# Patient Record
Sex: Male | Born: 1950 | Race: White | Hispanic: No | Marital: Married | Smoking: Never smoker
Health system: Southern US, Community
[De-identification: ages and names within clinical notes are randomized; demographics above are authoritative.]

## PROBLEM LIST (undated history)

## (undated) DIAGNOSIS — F1010 Alcohol abuse, uncomplicated: Secondary | ICD-10-CM

## (undated) DIAGNOSIS — I10 Essential (primary) hypertension: Secondary | ICD-10-CM

## (undated) DIAGNOSIS — K219 Gastro-esophageal reflux disease without esophagitis: Secondary | ICD-10-CM

## (undated) DIAGNOSIS — E119 Type 2 diabetes mellitus without complications: Secondary | ICD-10-CM

---

## 2013-03-08 ENCOUNTER — Emergency Department (HOSPITAL_COMMUNITY): Payer: BC Managed Care – PPO

## 2013-03-08 ENCOUNTER — Encounter (HOSPITAL_COMMUNITY): Payer: Self-pay | Admitting: Emergency Medicine

## 2013-03-08 ENCOUNTER — Observation Stay (HOSPITAL_COMMUNITY)
Admission: EM | Admit: 2013-03-08 | Discharge: 2013-03-09 | Disposition: A | Discharge status: Home / Self Care | Payer: BC Managed Care – PPO | Attending: General Surgery | Admitting: General Surgery

## 2013-03-08 DIAGNOSIS — K219 Gastro-esophageal reflux disease without esophagitis: Secondary | ICD-10-CM | POA: Insufficient documentation

## 2013-03-08 DIAGNOSIS — W108XXA Fall (on) (from) other stairs and steps, initial encounter: Secondary | ICD-10-CM | POA: Insufficient documentation

## 2013-03-08 DIAGNOSIS — S2249XA Multiple fractures of ribs, unspecified side, initial encounter for closed fracture: Secondary | ICD-10-CM

## 2013-03-08 DIAGNOSIS — R079 Chest pain, unspecified: Secondary | ICD-10-CM | POA: Insufficient documentation

## 2013-03-08 DIAGNOSIS — IMO0001 Reserved for inherently not codable concepts without codable children: Secondary | ICD-10-CM | POA: Insufficient documentation

## 2013-03-08 DIAGNOSIS — I10 Essential (primary) hypertension: Secondary | ICD-10-CM | POA: Insufficient documentation

## 2013-03-08 DIAGNOSIS — R0902 Hypoxemia: Secondary | ICD-10-CM | POA: Diagnosis present

## 2013-03-08 DIAGNOSIS — F1010 Alcohol abuse, uncomplicated: Secondary | ICD-10-CM | POA: Insufficient documentation

## 2013-03-08 DIAGNOSIS — H1045 Other chronic allergic conjunctivitis: Secondary | ICD-10-CM | POA: Insufficient documentation

## 2013-03-08 DIAGNOSIS — S2231XA Fracture of one rib, right side, initial encounter for closed fracture: Secondary | ICD-10-CM

## 2013-03-08 HISTORY — DX: Alcohol abuse, uncomplicated: F10.10

## 2013-03-08 HISTORY — DX: Essential (primary) hypertension: I10

## 2013-03-08 HISTORY — DX: Type 2 diabetes mellitus without complications: E11.9

## 2013-03-08 HISTORY — DX: Gastro-esophageal reflux disease without esophagitis: K21.9

## 2013-03-08 LAB — COMPREHENSIVE METABOLIC PANEL
ALT: 36 U/L (ref 0–53)
AST: 34 U/L (ref 0–37)
Albumin: 3.8 g/dL (ref 3.5–5.2)
Alkaline Phosphatase: 92 U/L (ref 39–117)
BUN: 21 mg/dL (ref 6–23)
Chloride: 104 mEq/L (ref 96–112)
Potassium: 4.3 mEq/L (ref 3.5–5.1)
Sodium: 141 mEq/L (ref 135–145)
Total Bilirubin: 0.3 mg/dL (ref 0.3–1.2)
Total Protein: 7.7 g/dL (ref 6.0–8.3)

## 2013-03-08 LAB — CBC WITH DIFFERENTIAL/PLATELET
Basophils Absolute: 0 10*3/uL (ref 0.0–0.1)
Basophils Relative: 1 % (ref 0–1)
Eosinophils Absolute: 0.1 10*3/uL (ref 0.0–0.7)
Hemoglobin: 15 g/dL (ref 13.0–17.0)
MCHC: 34.2 g/dL (ref 30.0–36.0)
Monocytes Relative: 10 % (ref 3–12)
Neutro Abs: 5.3 10*3/uL (ref 1.7–7.7)
Neutrophils Relative %: 70 % (ref 43–77)
Platelets: 201 10*3/uL (ref 150–400)

## 2013-03-08 LAB — MRSA PCR SCREENING: MRSA by PCR: NEGATIVE

## 2013-03-08 LAB — GLUCOSE, CAPILLARY
Glucose-Capillary: 101 mg/dL — ABNORMAL HIGH (ref 70–99)
Glucose-Capillary: 202 mg/dL — ABNORMAL HIGH (ref 70–99)

## 2013-03-08 MED ORDER — HYDROMORPHONE HCL PF 1 MG/ML IJ SOLN
1.0000 mg | Freq: Once | INTRAMUSCULAR | Status: AC
  Administered 2013-03-08: 1 mg via INTRAVENOUS
  Filled 2013-03-08: qty 1

## 2013-03-08 MED ORDER — INSULIN ASPART PROT & ASPART (70-30 MIX) 100 UNIT/ML ~~LOC~~ SUSP
15.0000 [IU] | Freq: Every day | SUBCUTANEOUS | Status: DC
  Filled 2013-03-08: qty 10

## 2013-03-08 MED ORDER — NEBIVOLOL HCL 10 MG PO TABS
10.0000 mg | ORAL_TABLET | Freq: Every day | ORAL | Status: DC
  Administered 2013-03-08: 10 mg via ORAL
  Filled 2013-03-08 (×2): qty 1

## 2013-03-08 MED ORDER — OXYCODONE HCL 5 MG PO TABS
10.0000 mg | ORAL_TABLET | ORAL | Status: DC | PRN
  Administered 2013-03-08 – 2013-03-09 (×2): 10 mg via ORAL
  Filled 2013-03-08 (×2): qty 2

## 2013-03-08 MED ORDER — IOHEXOL 300 MG/ML  SOLN
120.0000 mL | Freq: Once | INTRAMUSCULAR | Status: AC | PRN
  Administered 2013-03-08: 120 mL via INTRAVENOUS

## 2013-03-08 MED ORDER — ONDANSETRON HCL 8 MG PO TABS
4.0000 mg | ORAL_TABLET | Freq: Four times a day (QID) | ORAL | Status: DC | PRN
  Filled 2013-03-08: qty 0.5

## 2013-03-08 MED ORDER — ONDANSETRON HCL 4 MG/2ML IJ SOLN: 4.0000 mg | Freq: Four times a day (QID) | INTRAMUSCULAR | Status: DC | PRN

## 2013-03-08 MED ORDER — HYDROMORPHONE HCL PF 1 MG/ML IJ SOLN
0.5000 mg | INTRAMUSCULAR | Status: DC | PRN
  Administered 2013-03-08: 1 mg via INTRAVENOUS
  Filled 2013-03-08: qty 1

## 2013-03-08 MED ORDER — SODIUM CHLORIDE 0.9 % IV BOLUS (SEPSIS)
1000.0000 mL | Freq: Once | INTRAVENOUS | Status: AC
  Administered 2013-03-08: 1000 mL via INTRAVENOUS

## 2013-03-08 MED ORDER — HYDROMORPHONE HCL PF 1 MG/ML IJ SOLN: 1.0000 mg | INTRAMUSCULAR | Status: DC | PRN

## 2013-03-08 MED ORDER — ENOXAPARIN SODIUM 40 MG/0.4ML ~~LOC~~ SOLN
40.0000 mg | SUBCUTANEOUS | Status: DC
  Administered 2013-03-08: 40 mg via SUBCUTANEOUS
  Filled 2013-03-08 (×2): qty 0.4

## 2013-03-08 MED ORDER — KETOROLAC TROMETHAMINE 30 MG/ML IJ SOLN
30.0000 mg | Freq: Once | INTRAMUSCULAR | Status: AC
  Administered 2013-03-08: 30 mg via INTRAVENOUS
  Filled 2013-03-08: qty 1

## 2013-03-08 MED ORDER — AMLODIPINE BESYLATE 5 MG PO TABS
5.0000 mg | ORAL_TABLET | Freq: Every day | ORAL | Status: DC
  Administered 2013-03-08: 5 mg via ORAL
  Filled 2013-03-08 (×2): qty 1

## 2013-03-08 MED ORDER — POTASSIUM CHLORIDE IN NACL 20-0.9 MEQ/L-% IV SOLN
INTRAVENOUS | Status: DC
  Administered 2013-03-08: 10 mL via INTRAVENOUS
  Filled 2013-03-08: qty 1000

## 2013-03-08 MED ORDER — INSULIN ASPART PROT & ASPART (70-30 MIX) 100 UNIT/ML ~~LOC~~ SUSP
25.0000 [IU] | Freq: Every day | SUBCUTANEOUS | Status: DC
  Administered 2013-03-09: 25 [IU] via SUBCUTANEOUS
  Filled 2013-03-08: qty 10

## 2013-03-08 MED ORDER — DOCUSATE SODIUM 100 MG PO CAPS
100.0000 mg | ORAL_CAPSULE | Freq: Two times a day (BID) | ORAL | Status: DC
  Administered 2013-03-08: 100 mg via ORAL
  Filled 2013-03-08: qty 1

## 2013-03-08 MED ORDER — PANTOPRAZOLE SODIUM 40 MG PO TBEC
40.0000 mg | DELAYED_RELEASE_TABLET | Freq: Every day | ORAL | Status: DC
  Administered 2013-03-08: 40 mg via ORAL
  Filled 2013-03-08: qty 1

## 2013-03-08 MED ORDER — IRBESARTAN 300 MG PO TABS
300.0000 mg | ORAL_TABLET | Freq: Every day | ORAL | Status: DC
  Administered 2013-03-08: 300 mg via ORAL
  Filled 2013-03-08 (×2): qty 1

## 2013-03-08 MED ORDER — AMLODIPINE-OLMESARTAN 10-40 MG PO TABS: 1.0000 | ORAL_TABLET | Freq: Every day | ORAL | Status: DC

## 2013-03-08 NOTE — ED Notes (Signed)
On assessment pt hands are shaky.

## 2013-03-08 NOTE — H&P (Signed)
Ian Figueroa is an 62 y.o. male.   Chief Complaint: Right sided chest wall pain. HPI: Ground level fall and hit his right chest.  Passed out and has done so before.  The patient drinks a lot, but will not admit to being an alcoholic.  Past Medical History  Diagnosis Date  . Diabetes mellitus without complication   . Hypertension   . Acid reflux   . ETOH abuse     last drank on SAT.    History reviewed. No pertinent past surgical history.  History reviewed. No pertinent family history. Social History:  reports that he has never smoked. He does not have any smokeless tobacco history on file. He reports that  drinks alcohol. He reports that he does not use illicit drugs.  Allergies: No Known Allergies  Medications Prior to Admission  Medication Sig Dispense Refill  . amLODipine-olmesartan (AZOR) 10-40 MG per tablet Take 1 tablet by mouth daily.      . insulin aspart protamine- aspart (NOVOLOG 70/30) (70-30) 100 UNIT/ML injection Inject 15-25 Units into the skin 2 (two) times daily with a meal. Take 25 units at breakfast and 15 units with dinner      . Naproxen Sodium (ALEVE PO) Take 2 tablets by mouth every morning.      . nebivolol (BYSTOLIC) 10 MG tablet Take 10 mg by mouth daily.      Marland Kitchen omeprazole (PRILOSEC) 20 MG capsule Take 20 mg by mouth daily.        Results for orders placed during the hospital encounter of 03/08/13 (from the past 48 hour(s))  CBC WITH DIFFERENTIAL     Status: None   Collection Time    03/08/13 11:31 AM      Result Value Range   WBC 7.5  4.0 - 10.5 K/uL   RBC 5.08  4.22 - 5.81 MIL/uL   Hemoglobin 15.0  13.0 - 17.0 g/dL   HCT 11.9  14.7 - 82.9 %   MCV 86.4  78.0 - 100.0 fL   MCH 29.5  26.0 - 34.0 pg   MCHC 34.2  30.0 - 36.0 g/dL   RDW 56.2  13.0 - 86.5 %   Platelets 201  150 - 400 K/uL   Neutrophils Relative 70  43 - 77 %   Neutro Abs 5.3  1.7 - 7.7 K/uL   Lymphocytes Relative 19  12 - 46 %   Lymphs Abs 1.4  0.7 - 4.0 K/uL   Monocytes Relative 10   3 - 12 %   Monocytes Absolute 0.7  0.1 - 1.0 K/uL   Eosinophils Relative 1  0 - 5 %   Eosinophils Absolute 0.1  0.0 - 0.7 K/uL   Basophils Relative 1  0 - 1 %   Basophils Absolute 0.0  0.0 - 0.1 K/uL  COMPREHENSIVE METABOLIC PANEL     Status: Abnormal   Collection Time    03/08/13 11:31 AM      Result Value Range   Sodium 141  135 - 145 mEq/L   Potassium 4.3  3.5 - 5.1 mEq/L   Chloride 104  96 - 112 mEq/L   CO2 22  19 - 32 mEq/L   Glucose, Bld 166 (*) 70 - 99 mg/dL   BUN 21  6 - 23 mg/dL   Creatinine, Ser 7.84  0.50 - 1.35 mg/dL   Calcium 9.0  8.4 - 69.6 mg/dL   Total Protein 7.7  6.0 - 8.3 g/dL   Albumin 3.8  3.5 - 5.2 g/dL   AST 34  0 - 37 U/L   ALT 36  0 - 53 U/L   Alkaline Phosphatase 92  39 - 117 U/L   Total Bilirubin 0.3  0.3 - 1.2 mg/dL   GFR calc non Af Amer 60 (*) >90 mL/min   GFR calc Af Amer 69 (*) >90 mL/min   Comment:            The eGFR has been calculated     using the CKD EPI equation.     This calculation has not been     validated in all clinical     situations.     eGFR's persistently     <90 mL/min signify     possible Chronic Kidney Disease.  GLUCOSE, CAPILLARY     Status: Abnormal   Collection Time    03/08/13  5:14 PM      Result Value Range   Glucose-Capillary 101 (*) 70 - 99 mg/dL   Comment 1 Notify RN     Dg Ribs Unilateral W/chest Right  03/08/2013  *RADIOLOGY REPORT*  Clinical Data: Fall, syncope  RIGHT RIBS AND CHEST - 3+ VIEW  Comparison: None.  Findings: Borderline cardiomegaly.  Streaky left basilar atelectasis or infiltrate.  Displaced fracture of the right eighth rib.  Mild displaced fracture of the right ninth ribs.  No diagnostic pneumothorax.  IMPRESSION:  Streaky left basilar atelectasis or infiltrate.  Displaced fracture of the right eighth rib.  Mild displaced fracture of the right ninth ribs.  No diagnostic pneumothorax.   Original Report Authenticated By: Natasha Mead, M.D.    Ct Head Wo Contrast  03/08/2013  *RADIOLOGY REPORT*   Clinical Data: Syncope and fall.  CT HEAD WITHOUT CONTRAST  Technique:  Contiguous axial images were obtained from the base of the skull through the vertex without contrast.  Comparison: None.  Findings: No evidence for acute hemorrhage, mass lesion, midline shift, hydrocephalus or large infarct.  Normal appearance of the ventricles and basal cisterns.  Small calcification in the left basal ganglia.  No acute bony abnormality.  IMPRESSION: No acute intracranial abnormality.   Original Report Authenticated By: Richarda Overlie, M.D.    Ct Chest W Contrast  03/08/2013  *RADIOLOGY REPORT*  Clinical Data:  Syncopal episode and right rib pain.  CT CHEST, ABDOMEN AND PELVIS WITH CONTRAST  Technique:  Multidetector CT imaging of the chest, abdomen and pelvis was performed following the standard protocol during bolus administration of intravenous contrast.  Contrast: OMNIPAQUE IOHEXOL 300 MG/ML  SOLN  Comparison:  Chest radiograph 03/08/2013  CT CHEST  Findings:  No evidence for chest lymphadenopathy.  No evidence for a mediastinal hematoma.  Heart size is slightly enlarged.  No significant pericardial or pleural fluid.  The trachea and mainstem bronchi are patent.  Question a 3 mm pleural based nodule in the right upper lung on sequence three, image 16.  There may be an accessory fissure in the right upper lobe.  There is volume loss in the posterior right lower lobe. Patchy streaky densities in the left lung base most likely represent atelectasis.  Nondisplaced fracture of the lateral right seventh rib.  There is a displaced fracture of the right eighth rib. The right tenth rib is fractured in two spots.  There is no evidence for a pneumothorax. The right scapula is intact.  The shoulders appear to be located. Normal alignment of the thoracic spine.  IMPRESSION: Multiple right rib fractures.  No  evidence for a pneumothorax.  Low lung volumes with patchy areas of presumed atelectasis.  3 mm nodular structure in the right  upper lung is nonspecific.  If the patient is at high risk for bronchogenic carcinoma, follow-up chest CT at 1 year is recommended.  If the patient is at low risk, no follow-up is needed.  This recommendation follows the consensus statement: Guidelines for Management of Small Pulmonary Nodules Detected on CT Scans:  A Statement from the Fleischner Society as published in Radiology 2005; 237:395-400.  CT ABDOMEN AND PELVIS  Findings:  No evidence for free intraperitoneal air.  Low attenuation of the liver could represent hepatic steatosis.  No focal abnormality in the liver.  The gallbladder has been removed. Normal appearance of the portal venous system.  Normal appearance of the spleen, pancreas, kidneys and adrenal glands.  No significant free fluid or lymphadenopathy.  No gross abnormality to the urinary bladder, prostate or seminal vesicles.  Large amount stool in the cecum.  Appendix has a normal appearance.  Normal appearance of the small bowel structures.  Both hips are located.  There is subchondral sclerosis and mild cortical irregularity of the superior left femoral head. Degenerative facet changes in the lower lumbar spine.  IMPRESSION: No acute abnormality within the abdomen or pelvis.  Low attenuation of the liver may represent hepatic steatosis.  Subchondral sclerosis and irregularity of the left femoral head. Findings could be degenerative but cannot exclude AVN.   Original Report Authenticated By: Richarda Overlie, M.D.    Ct Abdomen Pelvis W Contrast  03/08/2013  *RADIOLOGY REPORT*  Clinical Data:  Syncopal episode and right rib pain.  CT CHEST, ABDOMEN AND PELVIS WITH CONTRAST  Technique:  Multidetector CT imaging of the chest, abdomen and pelvis was performed following the standard protocol during bolus administration of intravenous contrast.  Contrast: OMNIPAQUE IOHEXOL 300 MG/ML  SOLN  Comparison:  Chest radiograph 03/08/2013  CT CHEST  Findings:  No evidence for chest lymphadenopathy.  No  evidence for a mediastinal hematoma.  Heart size is slightly enlarged.  No significant pericardial or pleural fluid.  The trachea and mainstem bronchi are patent.  Question a 3 mm pleural based nodule in the right upper lung on sequence three, image 16.  There may be an accessory fissure in the right upper lobe.  There is volume loss in the posterior right lower lobe. Patchy streaky densities in the left lung base most likely represent atelectasis.  Nondisplaced fracture of the lateral right seventh rib.  There is a displaced fracture of the right eighth rib. The right tenth rib is fractured in two spots.  There is no evidence for a pneumothorax. The right scapula is intact.  The shoulders appear to be located. Normal alignment of the thoracic spine.  IMPRESSION: Multiple right rib fractures.  No evidence for a pneumothorax.  Low lung volumes with patchy areas of presumed atelectasis.  3 mm nodular structure in the right upper lung is nonspecific.  If the patient is at high risk for bronchogenic carcinoma, follow-up chest CT at 1 year is recommended.  If the patient is at low risk, no follow-up is needed.  This recommendation follows the consensus statement: Guidelines for Management of Small Pulmonary Nodules Detected on CT Scans:  A Statement from the Fleischner Society as published in Radiology 2005; 237:395-400.  CT ABDOMEN AND PELVIS  Findings:  No evidence for free intraperitoneal air.  Low attenuation of the liver could represent hepatic steatosis.  No focal  abnormality in the liver.  The gallbladder has been removed. Normal appearance of the portal venous system.  Normal appearance of the spleen, pancreas, kidneys and adrenal glands.  No significant free fluid or lymphadenopathy.  No gross abnormality to the urinary bladder, prostate or seminal vesicles.  Large amount stool in the cecum.  Appendix has a normal appearance.  Normal appearance of the small bowel structures.  Both hips are located.  There is  subchondral sclerosis and mild cortical irregularity of the superior left femoral head. Degenerative facet changes in the lower lumbar spine.  IMPRESSION: No acute abnormality within the abdomen or pelvis.  Low attenuation of the liver may represent hepatic steatosis.  Subchondral sclerosis and irregularity of the left femoral head. Findings could be degenerative but cannot exclude AVN.   Original Report Authenticated By: Richarda Overlie, M.D.     Review of Systems  Constitutional: Negative.   Eyes: Negative.   Respiratory: Positive for shortness of breath (mild hypoxermia).   Cardiovascular: Negative.   Gastrointestinal: Negative.   Genitourinary: Negative.   Musculoskeletal: Negative.   Neurological: Negative.   Endo/Heme/Allergies: Negative.   Psychiatric/Behavioral: Negative.     Blood pressure 159/79, pulse 91, temperature 98.3 F (36.8 C), temperature source Oral, resp. rate 33, height 6' (1.829 m), weight 109.2 kg (240 lb 11.9 oz), SpO2 97.00%. Physical Exam  Constitutional: He is oriented to person, place, and time. He appears well-developed and well-nourished.  HENT:  Head: Normocephalic and atraumatic.  Eyes: Conjunctivae and EOM are normal. Pupils are equal, round, and reactive to light.  Neck: Normal range of motion. Neck supple.  Cardiovascular: Normal rate, regular rhythm and normal heart sounds.   Respiratory: Effort normal and breath sounds normal. No respiratory distress. He exhibits tenderness. He exhibits no crepitus, no deformity and no swelling.    GI: Soft. Bowel sounds are normal.  Musculoskeletal: Normal range of motion.  Neurological: He is alert and oriented to person, place, and time. He has normal reflexes.  Skin: Skin is warm and dry.  Psychiatric: He has a normal mood and affect. His behavior is normal. Judgment and thought content normal.     Assessment/Plan Ground level fall with multiple right rib fractures.  I am not concerned about intra-abdominal  bleeding based on the patient's exam and he had a negative FAST.    Will admit for pain control and oxygen.  Also for ETOH withdrawal.  He was mildly hypoxemic.  Cherylynn Ridges 03/08/2013, 5:38 PM

## 2013-03-08 NOTE — ED Provider Notes (Signed)
History     CSN: [id number]  Arrival date & time 03/08/13  1104   First MD Initiated Contact with Patient 03/08/13 1117      Chief Complaint  Patient presents with  . Fall    (Consider location/radiation/quality/duration/timing/severity/associated sxs/prior treatment) Patient is a 62 y.o. male presenting with fall.  Fall   Pt from out of town, reports he was drinking last night and 'blacked out' while walking down some stairs, fell landing on his R side. He has had severe sharp pain in R chest and R lower back, worse with movement and coughing. Unable to take a deep breath due to pain. Denies head injury. No headache or neck pain. No hematuria or fever.   Past Medical History  Diagnosis Date  . Diabetes mellitus without complication   . Hypertension   . Acid reflux   . ETOH abuse     last drank on SAT.    History reviewed. No pertinent past surgical history.  History reviewed. No pertinent family history.  History  Substance Use Topics  . Smoking status: Never Smoker   . Smokeless tobacco: Not on file  . Alcohol Use: Yes      Review of Systems All other systems reviewed and are negative except as noted in HPI.   Allergies  Review of patient's allergies indicates no known allergies.  Home Medications   Current Outpatient Rx  Name  Route  Sig  Dispense  Refill  . amLODipine-olmesartan (AZOR) 10-40 MG per tablet   Oral   Take 1 tablet by mouth daily.         . insulin aspart protamine- aspart (NOVOLOG 70/30) (70-30) 100 UNIT/ML injection   Subcutaneous   Inject 15-25 Units into the skin daily with breakfast.         . Naproxen Sodium (ALEVE PO)   Oral   Take 2 tablets by mouth every morning.         . nebivolol (BYSTOLIC) 10 MG tablet   Oral   Take 10 mg by mouth daily.         Marland Kitchen omeprazole (PRILOSEC) 20 MG capsule   Oral   Take 20 mg by mouth daily.           BP 135/70  Pulse 100  Temp(Src) 98.3 F (36.8 C) (Oral)  Resp 22  SpO2  94%  Physical Exam  Nursing note and vitals reviewed. Constitutional: He is oriented to person, place, and time. He appears well-developed and well-nourished.  HENT:  Head: Normocephalic and atraumatic.  Eyes: EOM are normal. Pupils are equal, round, and reactive to light.  Neck: Normal range of motion. Neck supple.  Cardiovascular: Normal rate, normal heart sounds and intact distal pulses.   Pulmonary/Chest: Effort normal and breath sounds normal. He exhibits tenderness (diffuse right sided chest wall pain).  Abdominal: Bowel sounds are normal. He exhibits no distension. There is no tenderness.  Musculoskeletal: Normal range of motion. He exhibits no edema and no tenderness.  No midline cervical or lumbar spine tenderness  Neurological: He is alert and oriented to person, place, and time. He has normal strength. No cranial nerve deficit or sensory deficit.  Skin: Skin is warm and dry. No rash noted.  Psychiatric: He has a normal mood and affect.    ED Course  Procedures (including critical care time)  Labs Reviewed  COMPREHENSIVE METABOLIC PANEL - Abnormal; Notable for the following:    Glucose, Bld 166 (*)    GFR calc  non Af Amer 60 (*)    GFR calc Af Amer 69 (*)    All other components within normal limits  CBC WITH DIFFERENTIAL  URINALYSIS, ROUTINE W REFLEX MICROSCOPIC   Dg Ribs Unilateral W/chest Right  03/08/2013  *RADIOLOGY REPORT*  Clinical Data: Fall, syncope  RIGHT RIBS AND CHEST - 3+ VIEW  Comparison: None.  Findings: Borderline cardiomegaly.  Streaky left basilar atelectasis or infiltrate.  Displaced fracture of the right eighth rib.  Mild displaced fracture of the right ninth ribs.  No diagnostic pneumothorax.  IMPRESSION:  Streaky left basilar atelectasis or infiltrate.  Displaced fracture of the right eighth rib.  Mild displaced fracture of the right ninth ribs.  No diagnostic pneumothorax.   Original Report Authenticated By: Natasha Mead, M.D.    Ct Head Wo  Contrast  03/08/2013  *RADIOLOGY REPORT*  Clinical Data: Syncope and fall.  CT HEAD WITHOUT CONTRAST  Technique:  Contiguous axial images were obtained from the base of the skull through the vertex without contrast.  Comparison: None.  Findings: No evidence for acute hemorrhage, mass lesion, midline shift, hydrocephalus or large infarct.  Normal appearance of the ventricles and basal cisterns.  Small calcification in the left basal ganglia.  No acute bony abnormality.  IMPRESSION: No acute intracranial abnormality.   Original Report Authenticated By: Richarda Overlie, M.D.    Ct Chest W Contrast  03/08/2013  *RADIOLOGY REPORT*  Clinical Data:  Syncopal episode and right rib pain.  CT CHEST, ABDOMEN AND PELVIS WITH CONTRAST  Technique:  Multidetector CT imaging of the chest, abdomen and pelvis was performed following the standard protocol during bolus administration of intravenous contrast.  Contrast: OMNIPAQUE IOHEXOL 300 MG/ML  SOLN  Comparison:  Chest radiograph 03/08/2013  CT CHEST  Findings:  No evidence for chest lymphadenopathy.  No evidence for a mediastinal hematoma.  Heart size is slightly enlarged.  No significant pericardial or pleural fluid.  The trachea and mainstem bronchi are patent.  Question a 3 mm pleural based nodule in the right upper lung on sequence three, image 16.  There may be an accessory fissure in the right upper lobe.  There is volume loss in the posterior right lower lobe. Patchy streaky densities in the left lung base most likely represent atelectasis.  Nondisplaced fracture of the lateral right seventh rib.  There is a displaced fracture of the right eighth rib. The right tenth rib is fractured in two spots.  There is no evidence for a pneumothorax. The right scapula is intact.  The shoulders appear to be located. Normal alignment of the thoracic spine.  IMPRESSION: Multiple right rib fractures.  No evidence for a pneumothorax.  Low lung volumes with patchy areas of presumed  atelectasis.  3 mm nodular structure in the right upper lung is nonspecific.  If the patient is at high risk for bronchogenic carcinoma, follow-up chest CT at 1 year is recommended.  If the patient is at low risk, no follow-up is needed.  This recommendation follows the consensus statement: Guidelines for Management of Small Pulmonary Nodules Detected on CT Scans:  A Statement from the Fleischner Society as published in Radiology 2005; 237:395-400.  CT ABDOMEN AND PELVIS  Findings:  No evidence for free intraperitoneal air.  Low attenuation of the liver could represent hepatic steatosis.  No focal abnormality in the liver.  The gallbladder has been removed. Normal appearance of the portal venous system.  Normal appearance of the spleen, pancreas, kidneys and adrenal glands.  No  significant free fluid or lymphadenopathy.  No gross abnormality to the urinary bladder, prostate or seminal vesicles.  Large amount stool in the cecum.  Appendix has a normal appearance.  Normal appearance of the small bowel structures.  Both hips are located.  There is subchondral sclerosis and mild cortical irregularity of the superior left femoral head. Degenerative facet changes in the lower lumbar spine.  IMPRESSION: No acute abnormality within the abdomen or pelvis.  Low attenuation of the liver may represent hepatic steatosis.  Subchondral sclerosis and irregularity of the left femoral head. Findings could be degenerative but cannot exclude AVN.   Original Report Authenticated By: Richarda Overlie, M.D.    Ct Abdomen Pelvis W Contrast  03/08/2013  *RADIOLOGY REPORT*  Clinical Data:  Syncopal episode and right rib pain.  CT CHEST, ABDOMEN AND PELVIS WITH CONTRAST  Technique:  Multidetector CT imaging of the chest, abdomen and pelvis was performed following the standard protocol during bolus administration of intravenous contrast.  Contrast: OMNIPAQUE IOHEXOL 300 MG/ML  SOLN  Comparison:  Chest radiograph 03/08/2013  CT CHEST   Findings:  No evidence for chest lymphadenopathy.  No evidence for a mediastinal hematoma.  Heart size is slightly enlarged.  No significant pericardial or pleural fluid.  The trachea and mainstem bronchi are patent.  Question a 3 mm pleural based nodule in the right upper lung on sequence three, image 16.  There may be an accessory fissure in the right upper lobe.  There is volume loss in the posterior right lower lobe. Patchy streaky densities in the left lung base most likely represent atelectasis.  Nondisplaced fracture of the lateral right seventh rib.  There is a displaced fracture of the right eighth rib. The right tenth rib is fractured in two spots.  There is no evidence for a pneumothorax. The right scapula is intact.  The shoulders appear to be located. Normal alignment of the thoracic spine.  IMPRESSION: Multiple right rib fractures.  No evidence for a pneumothorax.  Low lung volumes with patchy areas of presumed atelectasis.  3 mm nodular structure in the right upper lung is nonspecific.  If the patient is at high risk for bronchogenic carcinoma, follow-up chest CT at 1 year is recommended.  If the patient is at low risk, no follow-up is needed.  This recommendation follows the consensus statement: Guidelines for Management of Small Pulmonary Nodules Detected on CT Scans:  A Statement from the Fleischner Society as published in Radiology 2005; 237:395-400.  CT ABDOMEN AND PELVIS  Findings:  No evidence for free intraperitoneal air.  Low attenuation of the liver could represent hepatic steatosis.  No focal abnormality in the liver.  The gallbladder has been removed. Normal appearance of the portal venous system.  Normal appearance of the spleen, pancreas, kidneys and adrenal glands.  No significant free fluid or lymphadenopathy.  No gross abnormality to the urinary bladder, prostate or seminal vesicles.  Large amount stool in the cecum.  Appendix has a normal appearance.  Normal appearance of the small  bowel structures.  Both hips are located.  There is subchondral sclerosis and mild cortical irregularity of the superior left femoral head. Degenerative facet changes in the lower lumbar spine.  IMPRESSION: No acute abnormality within the abdomen or pelvis.  Low attenuation of the liver may represent hepatic steatosis.  Subchondral sclerosis and irregularity of the left femoral head. Findings could be degenerative but cannot exclude AVN.   Original Report Authenticated By: Richarda Overlie, M.D.  1. Rib fractures, right, closed, initial encounter   2. Hypoxia       MDM  Labs and imaging reviewed. Rib fractures without underlying pulmonary or intraabdominal injury. Pt has hypoxia to 87% on room air improves with supplemental oxygen. Pain improved but still severe with movement. Discussed with Dr. Lindie Spruce on call for Trauma who will evaluate the patient. Toradol ordered as well.         Charles B. Bernette Mayers, MD 03/08/13 [id number]

## 2013-03-08 NOTE — ED Notes (Signed)
Pt c/o right rib pain after falling yesterday; pt unsure if on stairs or from ground due to ETOH use

## 2013-03-09 DIAGNOSIS — R0902 Hypoxemia: Secondary | ICD-10-CM

## 2013-03-09 DIAGNOSIS — I10 Essential (primary) hypertension: Secondary | ICD-10-CM | POA: Diagnosis present

## 2013-03-09 LAB — BASIC METABOLIC PANEL
BUN: 19 mg/dL (ref 6–23)
Creatinine, Ser: 1.08 mg/dL (ref 0.50–1.35)
GFR calc Af Amer: 84 mL/min — ABNORMAL LOW (ref >90)
GFR calc non Af Amer: 73 mL/min — ABNORMAL LOW (ref >90)
Glucose, Bld: 127 mg/dL — ABNORMAL HIGH (ref 70–99)
Potassium: 3.9 mEq/L (ref 3.5–5.1)

## 2013-03-09 LAB — CBC
HCT: 42.3 % (ref 39.0–52.0)
Hemoglobin: 14.3 g/dL (ref 13.0–17.0)
MCHC: 33.8 g/dL (ref 30.0–36.0)
MCV: 87.2 fL (ref 78.0–100.0)
RDW: 15.7 % — ABNORMAL HIGH (ref 11.5–15.5)

## 2013-03-09 MED ORDER — OLOPATADINE HCL 0.1 % OP SOLN: 1.0000 [drp] | Freq: Two times a day (BID) | OPHTHALMIC | Status: DC

## 2013-03-09 MED ORDER — OXYCODONE HCL 5 MG PO TABS: 5.0000 mg | ORAL_TABLET | Freq: Four times a day (QID) | ORAL | Status: DC | PRN

## 2013-03-09 MED ORDER — OLOPATADINE HCL 0.1 % OP SOLN: 1.0000 [drp] | Freq: Two times a day (BID) | OPHTHALMIC | Status: AC

## 2013-03-09 NOTE — Progress Notes (Signed)
Improved pain control.  Ready for D/C Patient examined and I agree with the assessment and plan  Violeta Gelinas, MD, MPH, FACS Pager: [phone]  03/09/2013 3:15 PM

## 2013-03-09 NOTE — Progress Notes (Signed)
Patient ID: Ian Figueroa, male   DOB: 13-Jan-1951, 62 y.o.   MRN: [id number]   LOS: 1 day   Subjective: Pt stable.  Complains of right sided rib pain, iv pain medication works for short periods, nursing giving pt IR oxycodone.  He has been up out of bed, able to tolerate okay.  Denies shortness of breath, chest pains or palpitations.  No dysuria, n/v.  He is alert and awake, sitting up in bed and eating breakfast.  Objective: Vital signs in last 24 hours: Temp:  [97.9 F (36.6 C)-98.4 F (36.9 C)] 98.2 F (36.8 C) (05/05 0808) Pulse Rate:  [72-100] 74 (05/05 0428) Resp:  [15-33] 17 (05/05 0428) BP: (113-164)/(70-113) 151/88 mmHg (05/05 0428) SpO2:  [87 %-99 %] 97 % (05/05 0428) Weight:  [240 lb 11.9 oz (109.2 kg)] 240 lb 11.9 oz (109.2 kg) (05/04 1723) Last BM Date: 03/08/13  Lab Results:  CBC  Recent Labs  03/08/13 1131 03/09/13 0535  WBC 7.5 4.2  HGB 15.0 14.3  HCT 43.9 42.3  PLT 201 156   BMET  Recent Labs  03/08/13 1131 03/09/13 0535  NA 141 141  K 4.3 3.9  CL 104 104  CO2 22 26  GLUCOSE 166* 127*  BUN 21 19  CREATININE 1.27 1.08  CALCIUM 9.0 8.8    Imaging: Dg Ribs Unilateral W/chest Right  03/08/2013  *RADIOLOGY REPORT*  Clinical Data: Fall, syncope  RIGHT RIBS AND CHEST - 3+ VIEW  Comparison: None.  Findings: Borderline cardiomegaly.  Streaky left basilar atelectasis or infiltrate.  Displaced fracture of the right eighth rib.  Mild displaced fracture of the right ninth ribs.  No diagnostic pneumothorax.  IMPRESSION:  Streaky left basilar atelectasis or infiltrate.  Displaced fracture of the right eighth rib.  Mild displaced fracture of the right ninth ribs.  No diagnostic pneumothorax.   Original Report Authenticated By: Natasha Mead, M.D.    Ct Head Wo Contrast  03/08/2013  *RADIOLOGY REPORT*  Clinical Data: Syncope and fall.  CT HEAD WITHOUT CONTRAST  Technique:  Contiguous axial images were obtained from the base of the skull through the vertex without  contrast.  Comparison: None.  Findings: No evidence for acute hemorrhage, mass lesion, midline shift, hydrocephalus or large infarct.  Normal appearance of the ventricles and basal cisterns.  Small calcification in the left basal ganglia.  No acute bony abnormality.  IMPRESSION: No acute intracranial abnormality.   Original Report Authenticated By: Richarda Overlie, M.D.    Ct Chest W Contrast  03/08/2013  *RADIOLOGY REPORT*  Clinical Data:  Syncopal episode and right rib pain.  CT CHEST, ABDOMEN AND PELVIS WITH CONTRAST  Technique:  Multidetector CT imaging of the chest, abdomen and pelvis was performed following the standard protocol during bolus administration of intravenous contrast.  Contrast: OMNIPAQUE IOHEXOL 300 MG/ML  SOLN  Comparison:  Chest radiograph 03/08/2013  CT CHEST  Findings:  No evidence for chest lymphadenopathy.  No evidence for a mediastinal hematoma.  Heart size is slightly enlarged.  No significant pericardial or pleural fluid.  The trachea and mainstem bronchi are patent.  Question a 3 mm pleural based nodule in the right upper lung on sequence three, image 16.  There may be an accessory fissure in the right upper lobe.  There is volume loss in the posterior right lower lobe. Patchy streaky densities in the left lung base most likely represent atelectasis.  Nondisplaced fracture of the lateral right seventh rib.  There is a displaced fracture  of the right eighth rib. The right tenth rib is fractured in two spots.  There is no evidence for a pneumothorax. The right scapula is intact.  The shoulders appear to be located. Normal alignment of the thoracic spine.  IMPRESSION: Multiple right rib fractures.  No evidence for a pneumothorax.  Low lung volumes with patchy areas of presumed atelectasis.  3 mm nodular structure in the right upper lung is nonspecific.  If the patient is at high risk for bronchogenic carcinoma, follow-up chest CT at 1 year is recommended.  If the patient is at low risk,  no follow-up is needed.  This recommendation follows the consensus statement: Guidelines for Management of Small Pulmonary Nodules Detected on CT Scans:  A Statement from the Fleischner Society as published in Radiology 2005; 237:395-400.  CT ABDOMEN AND PELVIS  Findings:  No evidence for free intraperitoneal air.  Low attenuation of the liver could represent hepatic steatosis.  No focal abnormality in the liver.  The gallbladder has been removed. Normal appearance of the portal venous system.  Normal appearance of the spleen, pancreas, kidneys and adrenal glands.  No significant free fluid or lymphadenopathy.  No gross abnormality to the urinary bladder, prostate or seminal vesicles.  Large amount stool in the cecum.  Appendix has a normal appearance.  Normal appearance of the small bowel structures.  Both hips are located.  There is subchondral sclerosis and mild cortical irregularity of the superior left femoral head. Degenerative facet changes in the lower lumbar spine.  IMPRESSION: No acute abnormality within the abdomen or pelvis.  Low attenuation of the liver may represent hepatic steatosis.  Subchondral sclerosis and irregularity of the left femoral head. Findings could be degenerative but cannot exclude AVN.   Original Report Authenticated By: Richarda Overlie, M.D.    Ct Abdomen Pelvis W Contrast  03/08/2013  *RADIOLOGY REPORT*  Clinical Data:  Syncopal episode and right rib pain.  CT CHEST, ABDOMEN AND PELVIS WITH CONTRAST  Technique:  Multidetector CT imaging of the chest, abdomen and pelvis was performed following the standard protocol during bolus administration of intravenous contrast.  Contrast: OMNIPAQUE IOHEXOL 300 MG/ML  SOLN  Comparison:  Chest radiograph 03/08/2013  CT CHEST  Findings:  No evidence for chest lymphadenopathy.  No evidence for a mediastinal hematoma.  Heart size is slightly enlarged.  No significant pericardial or pleural fluid.  The trachea and mainstem bronchi are patent.   Question a 3 mm pleural based nodule in the right upper lung on sequence three, image 16.  There may be an accessory fissure in the right upper lobe.  There is volume loss in the posterior right lower lobe. Patchy streaky densities in the left lung base most likely represent atelectasis.  Nondisplaced fracture of the lateral right seventh rib.  There is a displaced fracture of the right eighth rib. The right tenth rib is fractured in two spots.  There is no evidence for a pneumothorax. The right scapula is intact.  The shoulders appear to be located. Normal alignment of the thoracic spine.  IMPRESSION: Multiple right rib fractures.  No evidence for a pneumothorax.  Low lung volumes with patchy areas of presumed atelectasis.  3 mm nodular structure in the right upper lung is nonspecific.  If the patient is at high risk for bronchogenic carcinoma, follow-up chest CT at 1 year is recommended.  If the patient is at low risk, no follow-up is needed.  This recommendation follows the consensus statement: Guidelines for Management  of Small Pulmonary Nodules Detected on CT Scans:  A Statement from the Fleischner Society as published in Radiology 2005; 237:395-400.  CT ABDOMEN AND PELVIS  Findings:  No evidence for free intraperitoneal air.  Low attenuation of the liver could represent hepatic steatosis.  No focal abnormality in the liver.  The gallbladder has been removed. Normal appearance of the portal venous system.  Normal appearance of the spleen, pancreas, kidneys and adrenal glands.  No significant free fluid or lymphadenopathy.  No gross abnormality to the urinary bladder, prostate or seminal vesicles.  Large amount stool in the cecum.  Appendix has a normal appearance.  Normal appearance of the small bowel structures.  Both hips are located.  There is subchondral sclerosis and mild cortical irregularity of the superior left femoral head. Degenerative facet changes in the lower lumbar spine.  IMPRESSION: No acute  abnormality within the abdomen or pelvis.  Low attenuation of the liver may represent hepatic steatosis.  Subchondral sclerosis and irregularity of the left femoral head. Findings could be degenerative but cannot exclude AVN.   Original Report Authenticated By: Richarda Overlie, M.D.      PE: General appearance: alert, cooperative and appears stated age Resp: clear to auscultation bilaterally and no wheezes, crackles.  No cyansis, tachypnea or  stridor Chest wall: no tenderness, TTP right lateral chest wall Cardio: regular rate and rhythm, S1, S2 normal, no murmur, click, rub or gallop and +2 pedal pulses, no edema GI: soft, non-tender; bowel sounds normal; no masses,  no organomegaly Extremities: extremities normal, atraumatic, no cyanosis or edema Neurologic: Alert and oriented X 3, normal strength and tone. Normal symmetric reflexes. Normal coordination and gait Psychiatric:calm and cooperative.  Able to make sensible decisions.   Patient Active Problem List   Diagnosis Date Noted  . Acid reflux     Assessment and Plan  Multiple rib fractures -No evidence of pneumothorax -IS -Pain control Hypoxemia -multifactorial including trauma, pain, weight -improved, he is sating >93% on room air. VTE - SCD's, Lovenox, ambulate FEN - diabetic diet Dispo -discharge today, pt traveling back home to [place], need to reinforce frequent stops and ambulation to prevent DVT as well as avoiding driving while on pain medication. Hypertension -slightly hypertensive, continue to monitor Diabetes Mellitus -BG, Novolog 70/30 bid   Ashok Norris, ANP-BC Pager: [phone] General Trauma [place] Pager: [phone]   03/09/2013

## 2013-03-09 NOTE — Discharge Summary (Signed)
Physician Discharge Summary  Timber Lucarelli BJY:[id number] DOB: 02/28/1951 DOA: 03/08/2013  PCP: Pcp Not In System  Consultation: none  Admit date: 03/08/2013 Discharge date: 03/09/2013  Recommendations for Outpatient Follow-up:   Follow-up Information   Call [facility]. (Please call our clinic with any questions or concerns.)    Contact information:   [address] [place] [address]       Follow up with Pcp Not In System. (be sure to follow up with your primary care provider 1-2 weeks.)      Discharge Diagnoses:  1.  Multiple Rib Fractures 2.  Hypoxia   Surgical Procedure: none  Discharge Condition:  Disposition: home with family  Diet recommendation: Diabetic Diet  Filed Weights   03/08/13 1723  Weight: 240 lb 11.9 oz (109.2 kg)       Hospital Course:  Mr. Maltos presented to [facility] ED following a ground level fall.  Apparently, he is a drinker and has done this previously, pt had LOC.  A CT of chest revealed multiple rib fractures on the right without evidence of pneumothorax.  CT of abdomen and pelvis did not note any acute abnormalities.  CT of head was negative.  He was placed on ETOH withdrawal protocol  The patient had hypoxia following admission, he was placed on oxygen, symptoms resolved without further intervention.  He remained neurologically intact.  Up in room without difficulties.  Eating adequately, no dysuria, n/v. His pain was better controlled with oral pain medication.  He did well overnight and was felt to be safe for discharge home with family help.  He was given a prescription for pain medication.  He takes aleve everyday, I urged the patient to use conservatively given his diabetes and risk of nephropathy.  I asked him to address this with his PCP.  He was asked to follow up with his pcp in 1-2 weeks.  He will be traveling back to [place] Tuesday.  The patient was advised not to drive given pain medication regimen.  I  encouraged him to ambulating every 3-4 hours to reduce the risk of DVT.  Lastly, I provided him with a prescription for patanol for allergic conjunctivitis.     General appearance: alert and oriented. Calm and cooperative No acute distress. VSS. Afebrile.  Eyes: bilateral conjunctiva injection. Resp: clear to auscultation bilaterally.  TTP right lateral chest wall. Cardio: S1S1 RRR without murmurs or gallops. No edema. GI: soft round and nontender. +BS x4 quadrants. No organomegaly, hernias or masses.  Pulses: +2 bilateral distal pulses without cyanosis  Neurologic: Mental status: Alert, oriented, thought content appropriate  Psychiatric: calm and cooperative   Discharge Instructions     Medication List    TAKE these medications       ALEVE PO  Take 2 tablets by mouth every morning.     AZOR 10-40 MG per tablet  Generic drug:  amLODipine-olmesartan  Take 1 tablet by mouth daily.     insulin aspart protamine- aspart (70-30) 100 UNIT/ML injection  Commonly known as:  NOVOLOG 70/30  Inject 15-25 Units into the skin 2 (two) times daily with a meal. Take 25 units at breakfast and 15 units with dinner     nebivolol 10 MG tablet  Commonly known as:  BYSTOLIC  Take 10 mg by mouth daily.     omeprazole 20 MG capsule  Commonly known as:  PRILOSEC  Take 20 mg by mouth daily.     oxyCODONE 5  MG immediate release tablet  Commonly known as:  Oxy IR/ROXICODONE  Take 1-2 tablets (5-10 mg total) by mouth every 6 (six) hours as needed for pain.           Follow-up Information   Follow up with Pcp Not In System. (be sure to follow up with your primary care provider in 1-2 weeks.  )        The results of significant diagnostics from this hospitalization (including imaging, microbiology, ancillary and laboratory) are listed below for reference.    Significant Diagnostic Studies: Dg Ribs Unilateral W/chest Right  03/08/2013  *RADIOLOGY REPORT*  Clinical Data: Fall, syncope  RIGHT  RIBS AND CHEST - 3+ VIEW  Comparison: None.  Findings: Borderline cardiomegaly.  Streaky left basilar atelectasis or infiltrate.  Displaced fracture of the right eighth rib.  Mild displaced fracture of the right ninth ribs.  No diagnostic pneumothorax.  IMPRESSION:  Streaky left basilar atelectasis or infiltrate.  Displaced fracture of the right eighth rib.  Mild displaced fracture of the right ninth ribs.  No diagnostic pneumothorax.   Original Report Authenticated By: Natasha Mead, M.D.    Ct Head Wo Contrast  03/08/2013  *RADIOLOGY REPORT*  Clinical Data: Syncope and fall.  CT HEAD WITHOUT CONTRAST  Technique:  Contiguous axial images were obtained from the base of the skull through the vertex without contrast.  Comparison: None.  Findings: No evidence for acute hemorrhage, mass lesion, midline shift, hydrocephalus or large infarct.  Normal appearance of the ventricles and basal cisterns.  Small calcification in the left basal ganglia.  No acute bony abnormality.  IMPRESSION: No acute intracranial abnormality.   Original Report Authenticated By: Richarda Overlie, M.D.    Ct Chest W Contrast  03/08/2013  *RADIOLOGY REPORT*  Clinical Data:  Syncopal episode and right rib pain.  CT CHEST, ABDOMEN AND PELVIS WITH CONTRAST  Technique:  Multidetector CT imaging of the chest, abdomen and pelvis was performed following the standard protocol during bolus administration of intravenous contrast.  Contrast: OMNIPAQUE IOHEXOL 300 MG/ML  SOLN  Comparison:  Chest radiograph 03/08/2013  CT CHEST  Findings:  No evidence for chest lymphadenopathy.  No evidence for a mediastinal hematoma.  Heart size is slightly enlarged.  No significant pericardial or pleural fluid.  The trachea and mainstem bronchi are patent.  Question a 3 mm pleural based nodule in the right upper lung on sequence three, image 16.  There may be an accessory fissure in the right upper lobe.  There is volume loss in the posterior right lower lobe. Patchy streaky  densities in the left lung base most likely represent atelectasis.  Nondisplaced fracture of the lateral right seventh rib.  There is a displaced fracture of the right eighth rib. The right tenth rib is fractured in two spots.  There is no evidence for a pneumothorax. The right scapula is intact.  The shoulders appear to be located. Normal alignment of the thoracic spine.  IMPRESSION: Multiple right rib fractures.  No evidence for a pneumothorax.  Low lung volumes with patchy areas of presumed atelectasis.  3 mm nodular structure in the right upper lung is nonspecific.  If the patient is at high risk for bronchogenic carcinoma, follow-up chest CT at 1 year is recommended.  If the patient is at low risk, no follow-up is needed.  This recommendation follows the consensus statement: Guidelines for Management of Small Pulmonary Nodules Detected on CT Scans:  A Statement from the Fleischner Society as published  in Radiology 2005; [id number].  CT ABDOMEN AND PELVIS  Findings:  No evidence for free intraperitoneal air.  Low attenuation of the liver could represent hepatic steatosis.  No focal abnormality in the liver.  The gallbladder has been removed. Normal appearance of the portal venous system.  Normal appearance of the spleen, pancreas, kidneys and adrenal glands.  No significant free fluid or lymphadenopathy.  No gross abnormality to the urinary bladder, prostate or seminal vesicles.  Large amount stool in the cecum.  Appendix has a normal appearance.  Normal appearance of the small bowel structures.  Both hips are located.  There is subchondral sclerosis and mild cortical irregularity of the superior left femoral head. Degenerative facet changes in the lower lumbar spine.  IMPRESSION: No acute abnormality within the abdomen or pelvis.  Low attenuation of the liver may represent hepatic steatosis.  Subchondral sclerosis and irregularity of the left femoral head. Findings could be degenerative but cannot exclude AVN.    Original Report Authenticated By: Richarda Overlie, M.D.    Ct Abdomen Pelvis W Contrast  03/08/2013  *RADIOLOGY REPORT*  Clinical Data:  Syncopal episode and right rib pain.  CT CHEST, ABDOMEN AND PELVIS WITH CONTRAST  Technique:  Multidetector CT imaging of the chest, abdomen and pelvis was performed following the standard protocol during bolus administration of intravenous contrast.  Contrast: OMNIPAQUE IOHEXOL 300 MG/ML  SOLN  Comparison:  Chest radiograph 03/08/2013  CT CHEST  Findings:  No evidence for chest lymphadenopathy.  No evidence for a mediastinal hematoma.  Heart size is slightly enlarged.  No significant pericardial or pleural fluid.  The trachea and mainstem bronchi are patent.  Question a 3 mm pleural based nodule in the right upper lung on sequence three, image 16.  There may be an accessory fissure in the right upper lobe.  There is volume loss in the posterior right lower lobe. Patchy streaky densities in the left lung base most likely represent atelectasis.  Nondisplaced fracture of the lateral right seventh rib.  There is a displaced fracture of the right eighth rib. The right tenth rib is fractured in two spots.  There is no evidence for a pneumothorax. The right scapula is intact.  The shoulders appear to be located. Normal alignment of the thoracic spine.  IMPRESSION: Multiple right rib fractures.  No evidence for a pneumothorax.  Low lung volumes with patchy areas of presumed atelectasis.  3 mm nodular structure in the right upper lung is nonspecific.  If the patient is at high risk for bronchogenic carcinoma, follow-up chest CT at 1 year is recommended.  If the patient is at low risk, no follow-up is needed.  This recommendation follows the consensus statement: Guidelines for Management of Small Pulmonary Nodules Detected on CT Scans:  A Statement from the Fleischner Society as published in Radiology 2005; 237:395-400.  CT ABDOMEN AND PELVIS  Findings:  No evidence for free  intraperitoneal air.  Low attenuation of the liver could represent hepatic steatosis.  No focal abnormality in the liver.  The gallbladder has been removed. Normal appearance of the portal venous system.  Normal appearance of the spleen, pancreas, kidneys and adrenal glands.  No significant free fluid or lymphadenopathy.  No gross abnormality to the urinary bladder, prostate or seminal vesicles.  Large amount stool in the cecum.  Appendix has a normal appearance.  Normal appearance of the small bowel structures.  Both hips are located.  There is subchondral sclerosis and mild cortical irregularity of the  superior left femoral head. Degenerative facet changes in the lower lumbar spine.  IMPRESSION: No acute abnormality within the abdomen or pelvis.  Low attenuation of the liver may represent hepatic steatosis.  Subchondral sclerosis and irregularity of the left femoral head. Findings could be degenerative but cannot exclude AVN.   Original Report Authenticated By: Richarda Overlie, M.D.     Microbiology: Recent Results (from the past 240 hour(s))  MRSA PCR SCREENING     Status: None   Collection Time    03/08/13  4:58 PM      Result Value Range Status   MRSA by PCR NEGATIVE  NEGATIVE Final   Comment:            The GeneXpert MRSA Assay (FDA     approved for NASAL specimens     only), is one component of a     comprehensive MRSA colonization     surveillance program. It is not     intended to diagnose MRSA     infection nor to guide or     monitor treatment for     MRSA infections.     Labs: Basic Metabolic Panel:  Recent Labs Lab 03/08/13 1131 03/09/13 0535  NA 141 141  K 4.3 3.9  CL 104 104  CO2 22 26  GLUCOSE 166* 127*  BUN 21 19  CREATININE 1.27 1.08  CALCIUM 9.0 8.8   Liver Function Tests:  Recent Labs Lab 03/08/13 1131  AST 34  ALT 36  ALKPHOS 92  BILITOT 0.3  PROT 7.7  ALBUMIN 3.8   No results found for this basename: LIPASE, AMYLASE,  in the last 168 hours No results  found for this basename: AMMONIA,  in the last 168 hours CBC:  Recent Labs Lab 03/08/13 1131 03/09/13 0535  WBC 7.5 4.2  NEUTROABS 5.3  --   HGB 15.0 14.3  HCT 43.9 42.3  MCV 86.4 87.2  PLT 201 156   Cardiac Enzymes: No results found for this basename: CKTOTAL, CKMB, CKMBINDEX, TROPONINI,  in the last 168 hours BNP: BNP (last 3 results) No results found for this basename: PROBNP,  in the last 8760 hours CBG:  Recent Labs Lab 03/08/13 1714 03/08/13 2208  GLUCAP 101* 202*    Active Problems:   Essential hypertension, benign   Type II or unspecified type diabetes mellitus without mention of complication, uncontrolled   Fracture of multiple ribs   Hypoxemia   Time coordinating discharge: 30 mins  Signed:  Ashok Norris, ANP-BC Pager [phone] Office [phone]

## 2013-03-09 NOTE — Progress Notes (Signed)
Pt discharge home with wife, per MD order, discharge instructions, prescription, and IS given. Verbalized understanding, PIV removed. Pt will follow up with PCP once back home.

## 2013-03-09 NOTE — Discharge Summary (Signed)
Ian Freitas, MD, MPH, FACS Pager: [phone]  

## 2013-03-09 NOTE — Progress Notes (Signed)
UR completed 

## 2014-04-03 IMAGING — CT CT HEAD W/O CM
1 of 2 series · 16 of 30 positions shown, 20 images · non-contrast
Comparison: None.

CLINICAL DATA: Syncope and fall.

CT HEAD WITHOUT CONTRAST
TECHNIQUE: Contiguous axial images were obtained from the base of
the skull through the vertex without contrast.

[Series 3: recon 2: brain · axial · 0.49mm/px · z∈[+112,+284]mm · 16 of 72 slices shown, 20 images]
[im 4/72  brain]
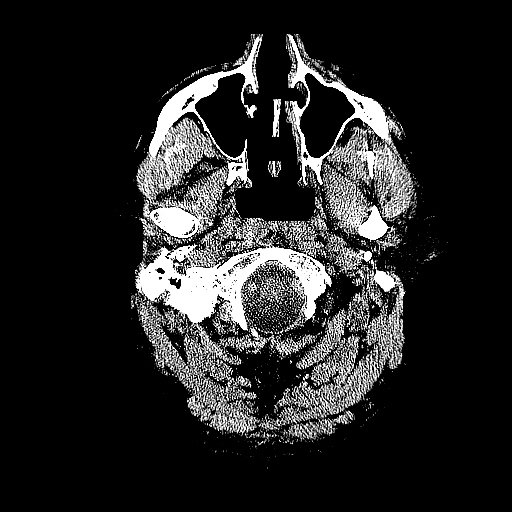
[im 4/72  bone]
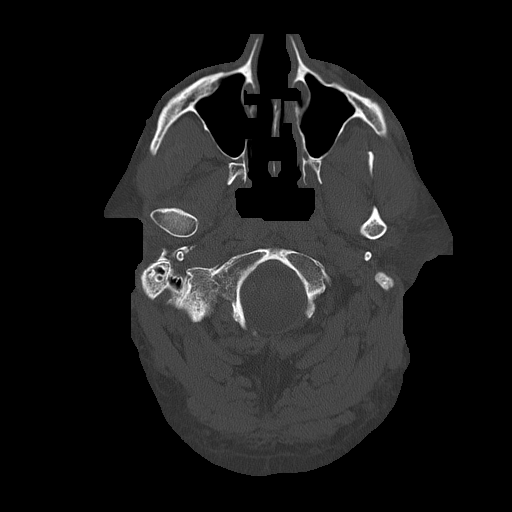
[im 8/72  brain]
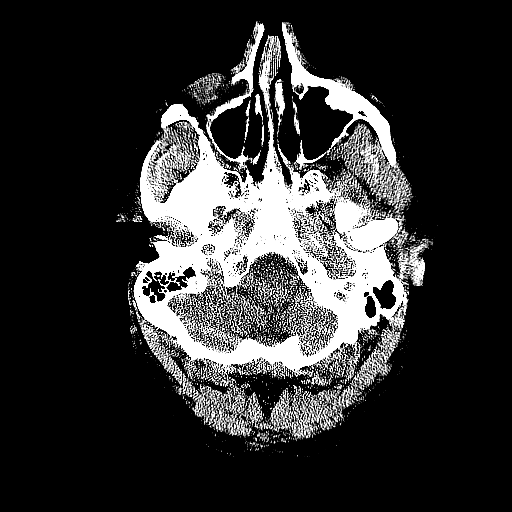
[im 12/72  brain]
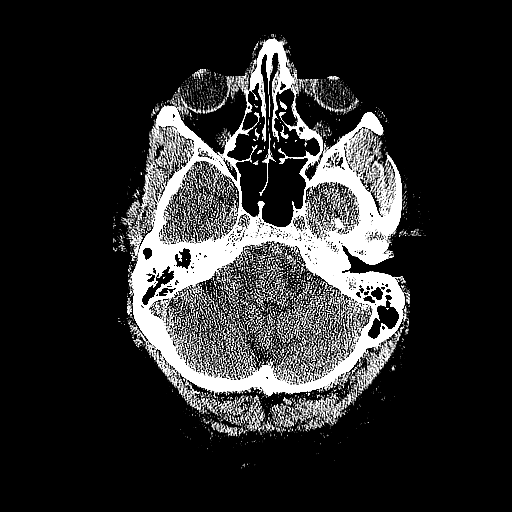
[im 15/72  brain]
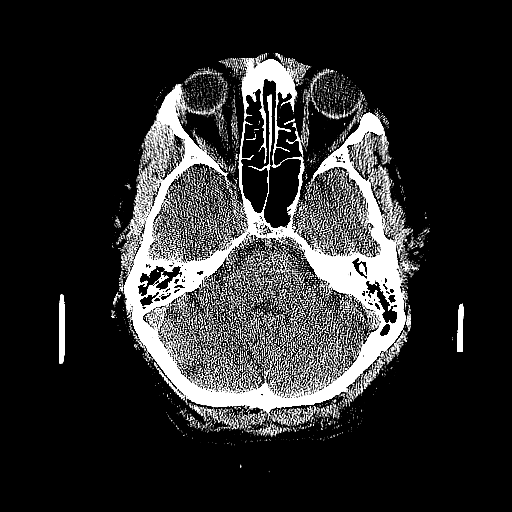
[im 23/72  brain]
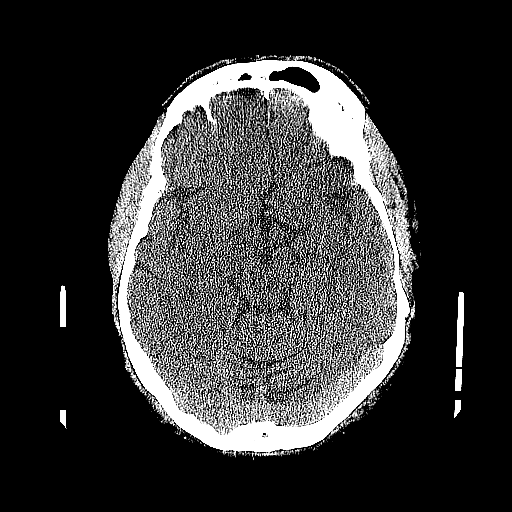
[im 23/72  bone]
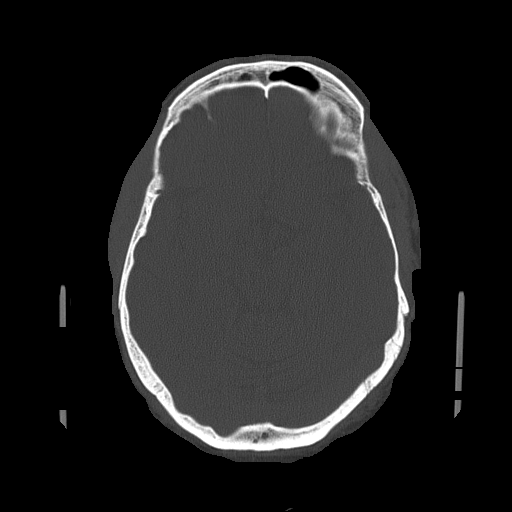
[im 27/72  brain]
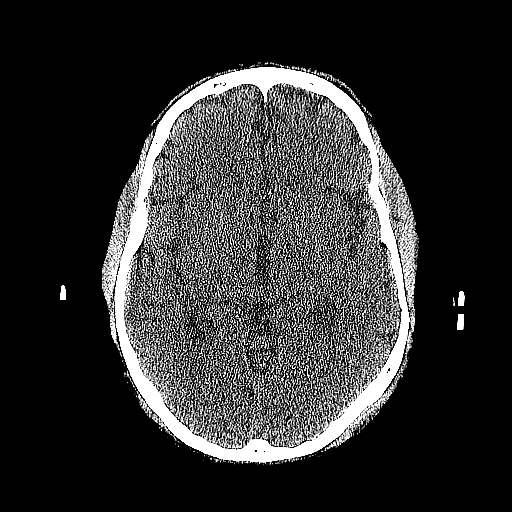
[im 30/72  brain]
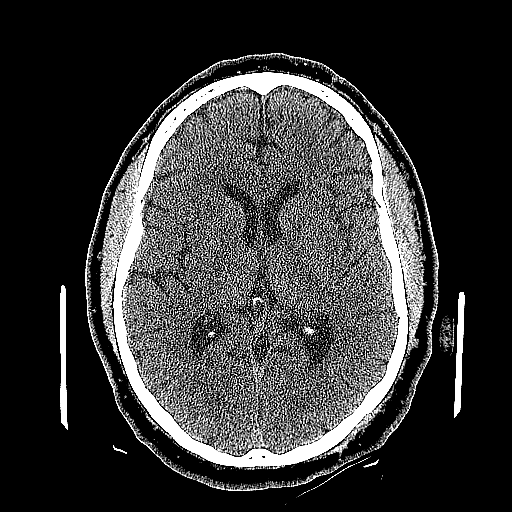
[im 34/72  brain]
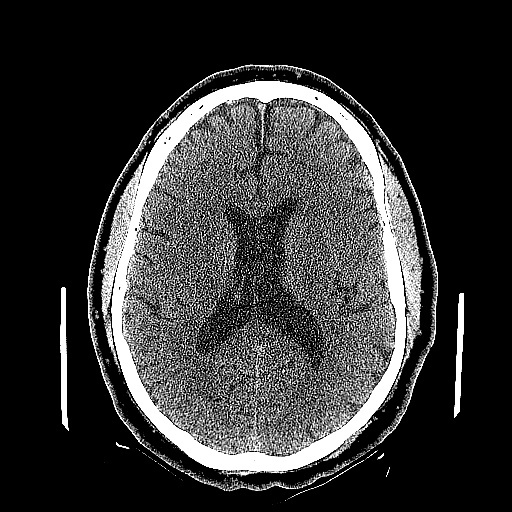
[im 38/72  brain]
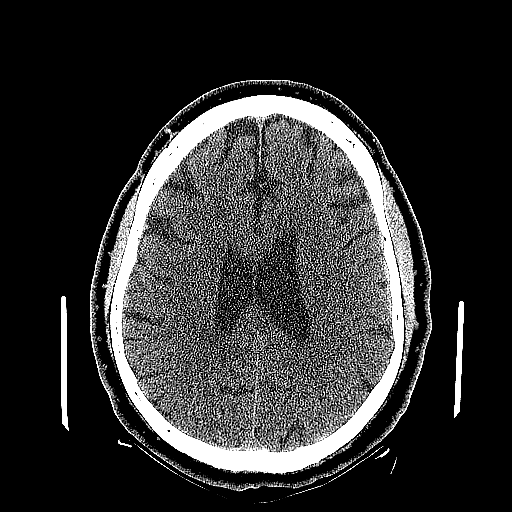
[im 38/72  bone]
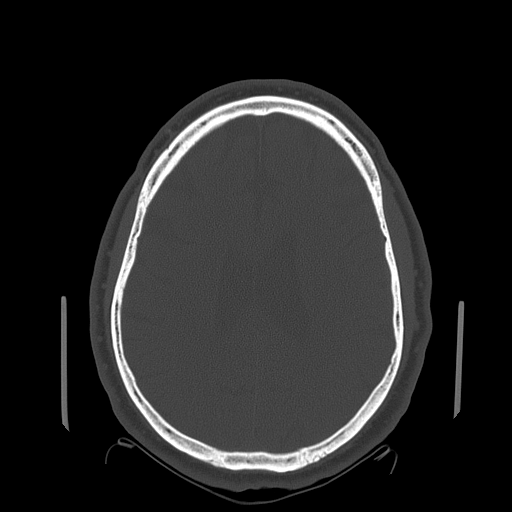
[im 42/72  brain]
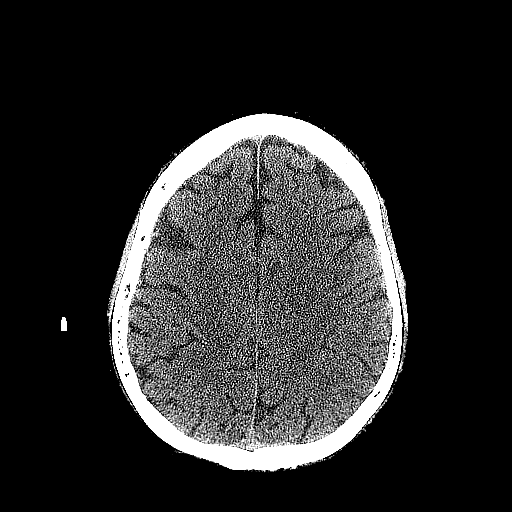
[im 45/72  brain]
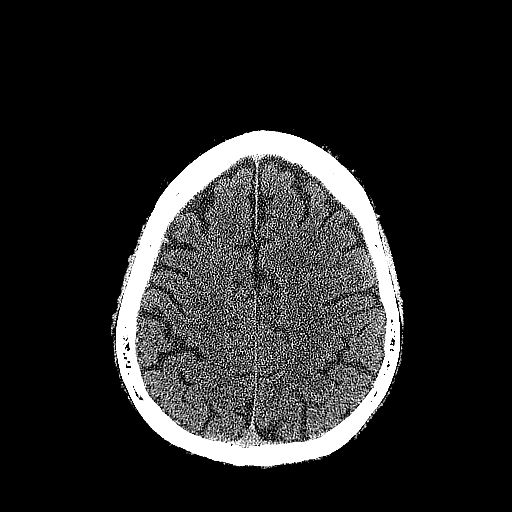
[im 49/72  brain]
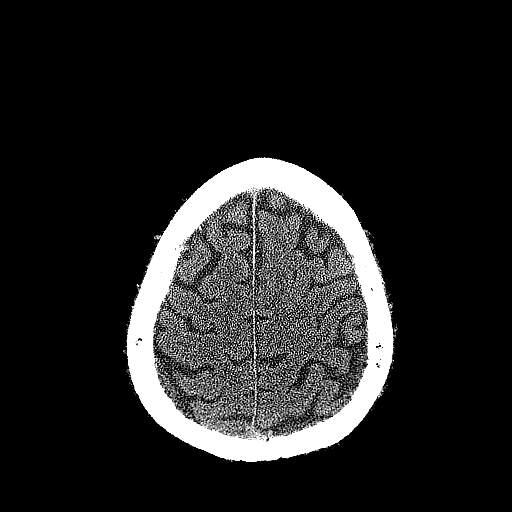
[im 57/72  brain]
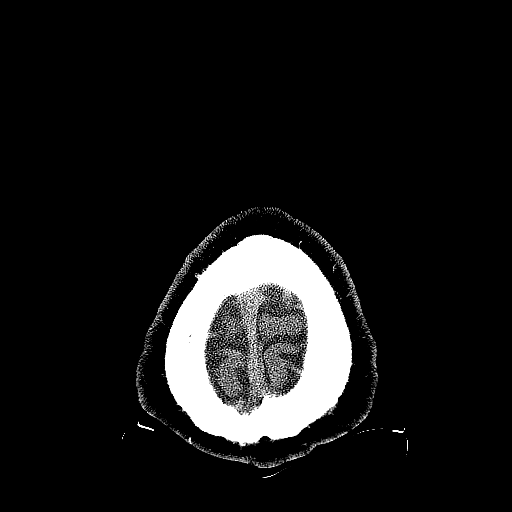
[im 57/72  bone]
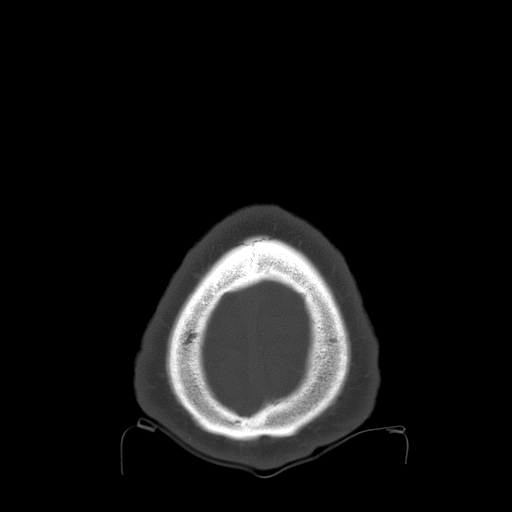
[im 60/72  brain]
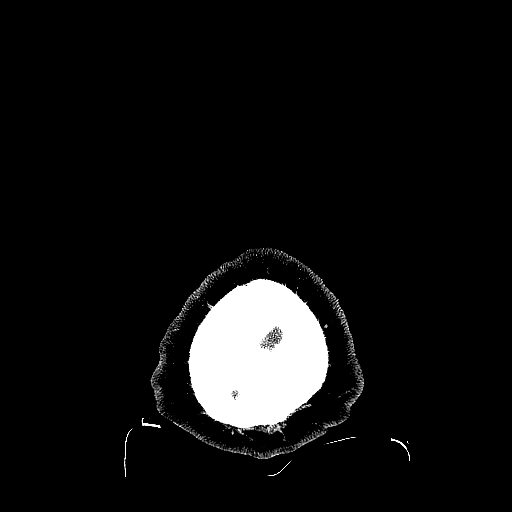
[im 64/72  brain]
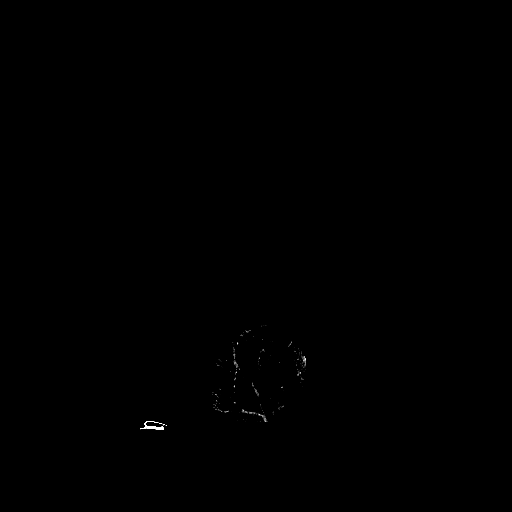
[im 68/72  brain]
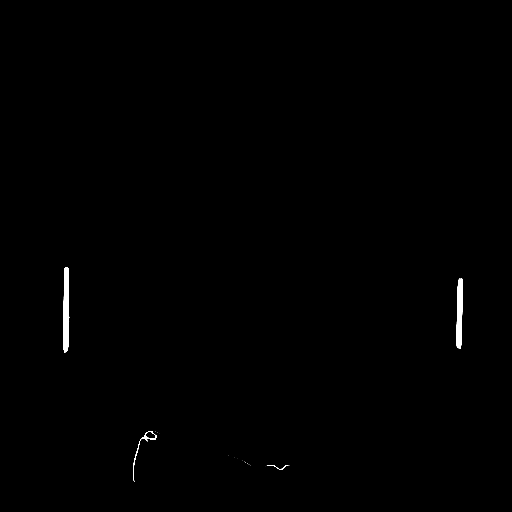

[16 of 30 positions shown; findings below may reference images not displayed]

FINDINGS: No evidence for acute hemorrhage, mass lesion, midline
shift, hydrocephalus or large infarct.  Normal appearance of the
ventricles and basal cisterns.  Small calcification in the left
basal ganglia.  No acute bony abnormality.
IMPRESSION: No acute intracranial abnormality.

## 2014-04-03 IMAGING — CR DG RIBS W/ CHEST 3+V*R*
5 series · 5 of 5 positions shown · non-contrast
Comparison: None.

CLINICAL DATA: Fall, syncope

RIGHT RIBS AND CHEST - 3+ VIEW

[t chest supine]
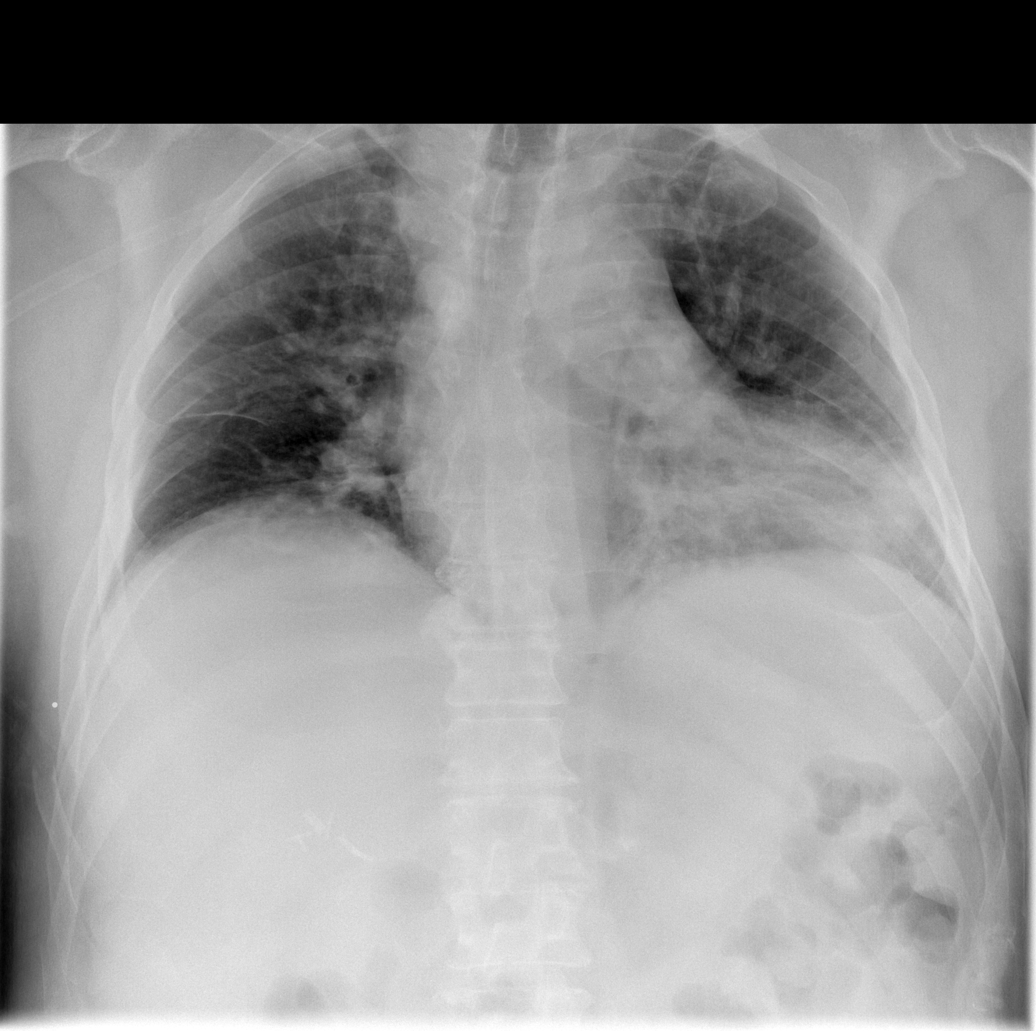

[t ribs ap/pa upper right]
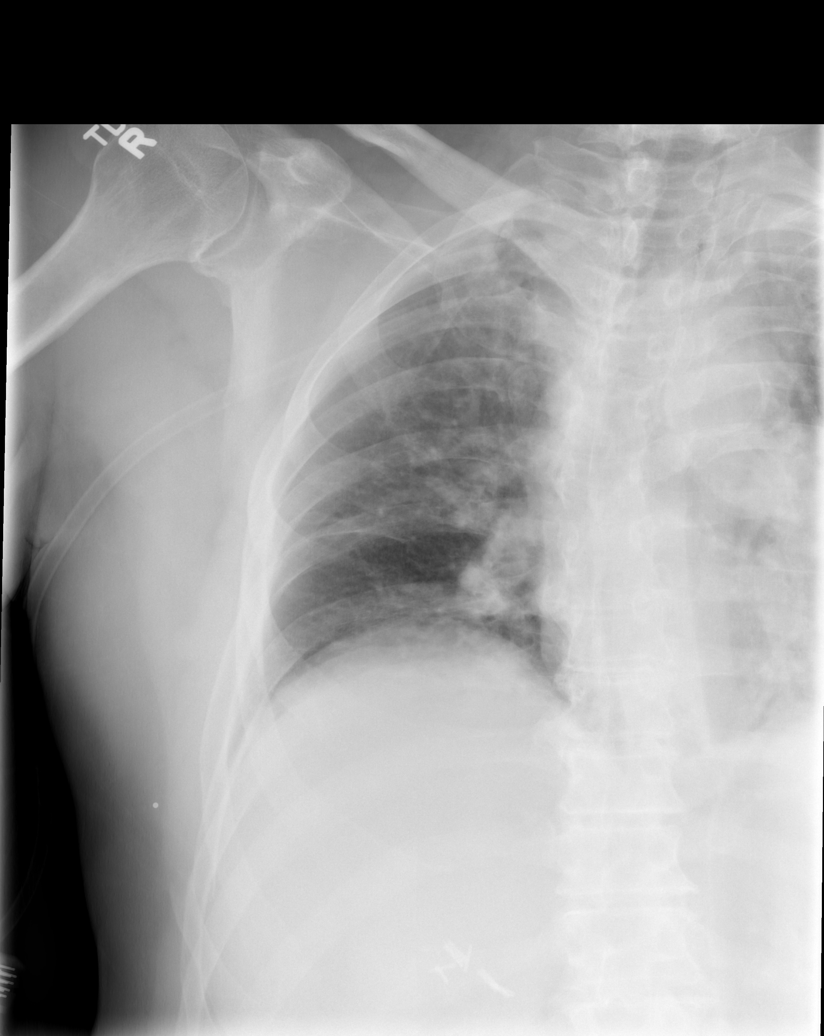

[t ribs ap/pa  lower right]
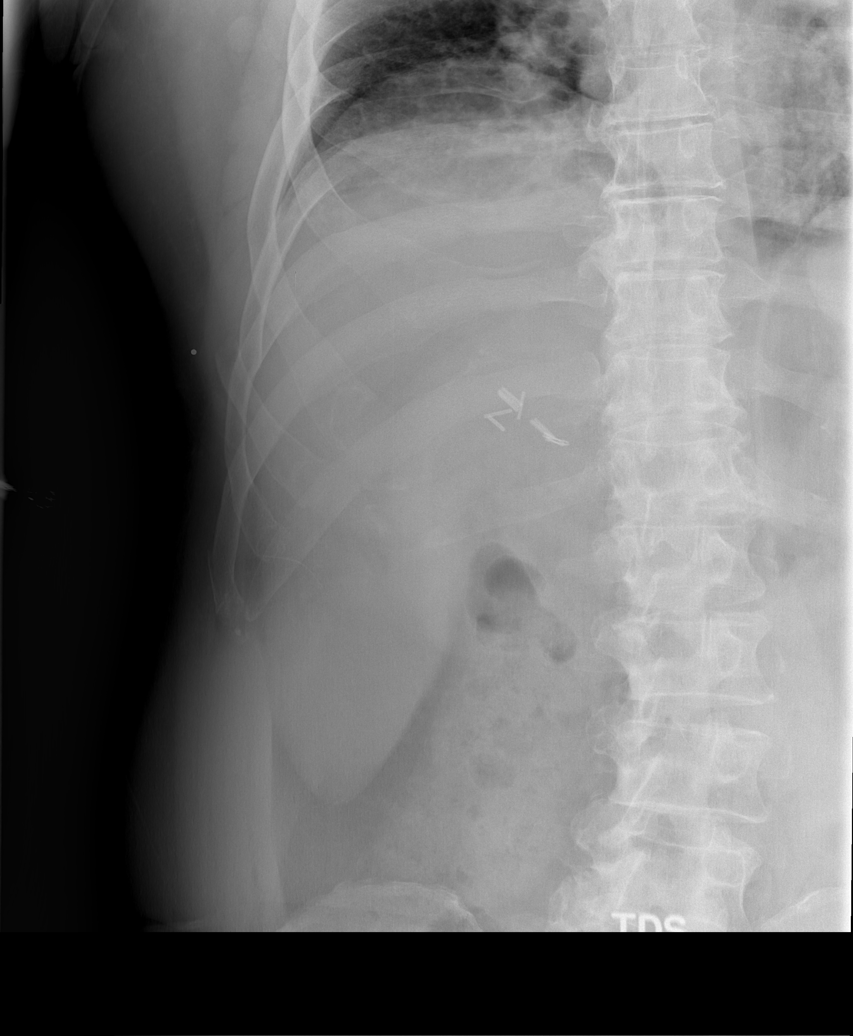

[t ribs obl. right (1 of 2)]
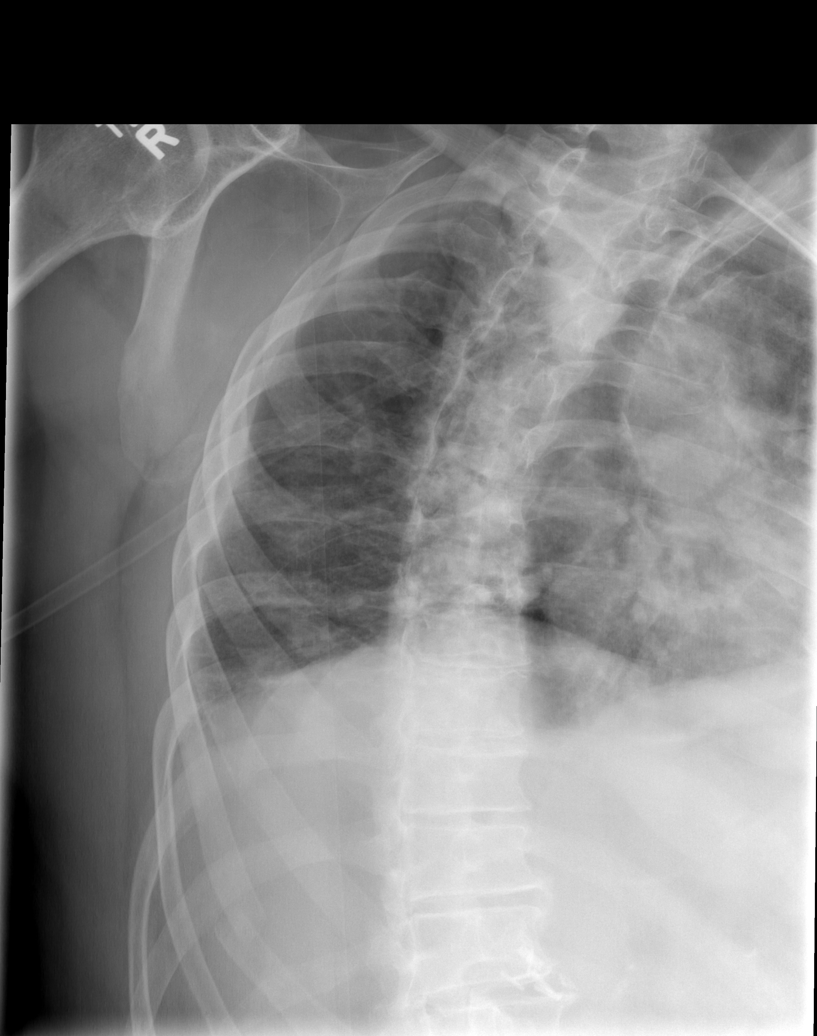

[t ribs obl. right (2 of 2)]
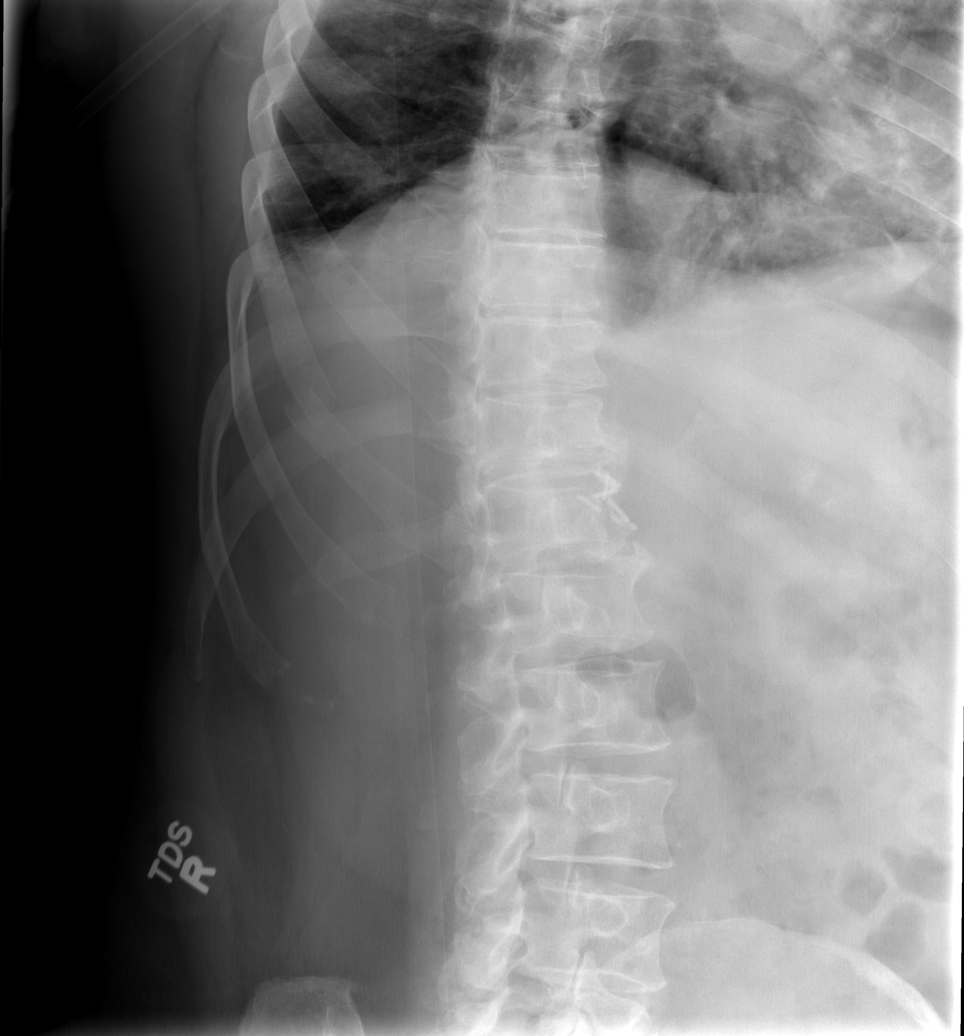

[5 of 5 positions shown; findings below may reference images not displayed]

FINDINGS: Borderline cardiomegaly.  Streaky left basilar
atelectasis or infiltrate.  Displaced fracture of the right eighth
rib.  Mild displaced fracture of the right ninth ribs.  No
diagnostic pneumothorax.
IMPRESSION: Streaky left basilar atelectasis or infiltrate.  Displaced fracture
of the right eighth rib.  Mild displaced fracture of the right
ninth ribs.  No diagnostic pneumothorax.]

## 2019-12-16 ENCOUNTER — Ambulatory Visit (INDEPENDENT_AMBULATORY_CARE_PROVIDER_SITE_OTHER): Payer: Medicare Other | Admitting: Podiatry

## 2019-12-16 ENCOUNTER — Encounter: Payer: Self-pay | Admitting: Podiatry

## 2019-12-16 ENCOUNTER — Other Ambulatory Visit: Payer: Self-pay | Admitting: Podiatry

## 2019-12-16 ENCOUNTER — Ambulatory Visit (INDEPENDENT_AMBULATORY_CARE_PROVIDER_SITE_OTHER): Payer: Medicare Other

## 2019-12-16 ENCOUNTER — Other Ambulatory Visit: Payer: Self-pay

## 2019-12-16 VITALS — BP 139/68 | HR 75 | Temp 97.3°F | Resp 16

## 2019-12-16 DIAGNOSIS — R2681 Unsteadiness on feet: Secondary | ICD-10-CM

## 2019-12-16 DIAGNOSIS — G629 Polyneuropathy, unspecified: Secondary | ICD-10-CM

## 2019-12-16 DIAGNOSIS — M779 Enthesopathy, unspecified: Secondary | ICD-10-CM

## 2019-12-16 DIAGNOSIS — M79671 Pain in right foot: Secondary | ICD-10-CM | POA: Diagnosis not present

## 2019-12-16 DIAGNOSIS — M79672 Pain in left foot: Secondary | ICD-10-CM | POA: Diagnosis not present

## 2019-12-16 NOTE — Progress Notes (Signed)
   Subjective:    Patient ID: Ian Figueroa, male    DOB: [birth year], 69 y.o.   MRN: [id number]  HPI    Review of Systems  All other systems reviewed and are negative.      Objective:   Physical Exam        Assessment & Plan:

## 2019-12-16 NOTE — Progress Notes (Signed)
Subjective:   Patient ID: Ian Figueroa, male   DOB: 69 y.o.   MRN: [id number]   HPI Patient presents stating he has had pain in both his feet and a long-term history of neuropathy.  Patient has had a number of falls and has gait instability secondary to his neuropathy and has broken his left leg and shoulder over time due to falls.  Patient is trying to stay active and patient does not smoke or drink at the current time   Review of Systems  All other systems reviewed and are negative.       Objective:  Physical Exam Vitals and nursing note reviewed.  Constitutional:      Appearance: He is well-developed.  Pulmonary:     Effort: Pulmonary effort is normal.  Musculoskeletal:        General: Normal range of motion.  Skin:    General: Skin is warm.  Neurological:     Mental Status: He is alert.     Vascular status was found to be intact with neurological diminishment of sharp dull vibratory bilateral.  Patient does have inflammation pain around the fifth metatarsal base right and around the first MPJ left with pain in both areas.  Patient has good digital perfusion had a history of alcoholism which may have been part of the neuropathy along with diabetes for a number of years and a lot of problems with his back     Assessment:  Gait instability with back issues neuropathy and inflammation tendinitis right hallux limitus left with capsulitis     Plan:  H&P reviewed all conditions with patient.  I have recommended physical therapy to try to help him from the standpoint of gait instability history of falls and I have recommended bilateral balance bracing to try to help this condition and I am referring him to [facility] orthotist for this.  I did do a careful injection of the tendon insertion right fifth metatarsal and around the first MPJ of the left 3 mg dexamethasone Kenalog.  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

## 2019-12-17 ENCOUNTER — Other Ambulatory Visit: Payer: Medicare Other | Admitting: Orthotics

## 2020-02-01 ENCOUNTER — Encounter: Payer: Self-pay | Admitting: Podiatry

## 2020-02-01 ENCOUNTER — Ambulatory Visit (INDEPENDENT_AMBULATORY_CARE_PROVIDER_SITE_OTHER): Payer: Medicare Other | Admitting: Podiatry

## 2020-02-01 ENCOUNTER — Telehealth: Payer: Self-pay | Admitting: *Deleted

## 2020-02-01 ENCOUNTER — Other Ambulatory Visit: Payer: Self-pay

## 2020-02-01 DIAGNOSIS — G629 Polyneuropathy, unspecified: Secondary | ICD-10-CM | POA: Diagnosis not present

## 2020-02-01 DIAGNOSIS — R2681 Unsteadiness on feet: Secondary | ICD-10-CM

## 2020-02-01 DIAGNOSIS — T148XXA Other injury of unspecified body region, initial encounter: Secondary | ICD-10-CM

## 2020-02-01 DIAGNOSIS — M7671 Peroneal tendinitis, right leg: Secondary | ICD-10-CM

## 2020-02-01 DIAGNOSIS — M779 Enthesopathy, unspecified: Secondary | ICD-10-CM

## 2020-02-01 NOTE — Telephone Encounter (Signed)
[place] [employer]- Steward Drone states Case is pending, Tracking [phone] [phone] will have determination in 24 hours.

## 2020-02-03 ENCOUNTER — Ambulatory Visit
Admission: RE | Admit: 2020-02-03 | Discharge: 2020-02-03 | Disposition: A | Discharge status: Home / Self Care | Payer: Medicare Other | Source: Ambulatory Visit | Attending: Podiatry | Admitting: Podiatry

## 2020-02-03 ENCOUNTER — Other Ambulatory Visit: Payer: Self-pay

## 2020-02-03 DIAGNOSIS — T148XXA Other injury of unspecified body region, initial encounter: Secondary | ICD-10-CM

## 2020-02-03 DIAGNOSIS — R2681 Unsteadiness on feet: Secondary | ICD-10-CM

## 2020-02-03 DIAGNOSIS — M779 Enthesopathy, unspecified: Secondary | ICD-10-CM

## 2020-02-03 DIAGNOSIS — M7671 Peroneal tendinitis, right leg: Secondary | ICD-10-CM

## 2020-02-03 NOTE — Progress Notes (Signed)
Subjective:   Patient ID: Ian Figueroa, male   DOB: 69 y.o.   MRN: [id number]   HPI Patient presents stating that the inside of his right foot has really started to hurt him again and he got temporary relief but it seems to be bad with swelling occurring around the tendon   ROS      Objective:  Physical Exam  Neurovascular status found to be intact muscle strength found to be adequate with exquisite discomfort at the peroneal insertion base of fifth metatarsal right with proximal swelling noted within the tendon itself and possible dysfunction of the tendon occurring     Assessment:  Possibility for torn peroneal tendon right at the insertion base of fifth metatarsal     Plan:  H&P reviewed and discussed condition with patient and this time I went ahead and due to the intensity of discomfort I did acquire fracture walker to mobilize.  I then discussed MRI to see whether or not there is a tear with a strong possibility for repair if there is a tear of the peroneal tendon.  Patient scheduled for MRI we will see results and decide what might be helpful for him

## 2020-02-03 NOTE — Telephone Encounter (Signed)
Prior Authorization for determined coverage for MRI right foot [phone] was not received in the 24 hour timeframe stated by 02/01/2020 [employer] representative-Brenda. I spoke with [employer] [place] [employer]-Cindy states Tracking# [id number] shows eligibility withdrawn due to Plan change and this new Plan is not authorized by [facility], Reference: [id number].

## 2021-08-05 DEATH — deceased
